# Patient Record
Sex: Female | Born: 1995 | Race: Black or African American | Hispanic: No | Marital: Single | Smoking: Never smoker
Health system: Southern US, Community
[De-identification: ages and names within clinical notes are randomized; demographics above are authoritative.]

---

## 2016-04-28 ENCOUNTER — Emergency Department (HOSPITAL_COMMUNITY): Payer: BLUE CROSS/BLUE SHIELD

## 2016-04-28 ENCOUNTER — Emergency Department (HOSPITAL_COMMUNITY)
Admission: EM | Admit: 2016-04-28 | Discharge: 2016-04-28 | Disposition: A | Discharge status: Home / Self Care | Payer: BLUE CROSS/BLUE SHIELD | Attending: Emergency Medicine | Admitting: Emergency Medicine

## 2016-04-28 ENCOUNTER — Encounter (HOSPITAL_COMMUNITY): Payer: Self-pay

## 2016-04-28 DIAGNOSIS — R05 Cough: Secondary | ICD-10-CM | POA: Diagnosis present

## 2016-04-28 DIAGNOSIS — B349 Viral infection, unspecified: Secondary | ICD-10-CM | POA: Diagnosis not present

## 2016-04-28 MED ORDER — ACETAMINOPHEN 325 MG PO TABS
650.0000 mg | ORAL_TABLET | Freq: Once | ORAL | Status: AC
  Administered 2016-04-28: 650 mg via ORAL
  Filled 2016-04-28: qty 2

## 2016-04-28 MED ORDER — ACETAMINOPHEN 325 MG PO TABS: 650.0000 mg | ORAL_TABLET | Freq: Once | ORAL | Status: DC

## 2016-04-28 NOTE — ED Triage Notes (Addendum)
PT C/O FEVER AND A NONPRODUCTIVE COUGH SINCE Monday. DENIES SORE THROAT, NAUSEA, OR VOMITING. PT LAST TOOK TYLENOL THIS AM.

## 2016-04-28 NOTE — ED Notes (Signed)
Patient transported to X-ray 

## 2016-04-28 NOTE — ED Notes (Signed)
Patient was alert, oriented and stable upon discharge. RN went over AVS and patient had no further questions.  

## 2016-04-28 NOTE — ED Provider Notes (Signed)
[facility]-EMERGENCY DEPT Provider Note   CSN: [id number] Arrival date & time: 04/28/16  1541     History   Chief Complaint Chief Complaint  Patient presents with  . Fever  . Cough    HPI Lynnell Abdon is a 20 y.o. female.Complains of cough onset 3 days ago accompanied by fever. Other associated symptoms include nasal congestion. She denies sore throat denies myalgias denies vomiting or nausea. Treated with DayQuil, NyQuil and Tylenol, without relief. Last dose of Tylenol 10 AM today. No other associated symptoms. Nothing makes symptoms better or worse. She denies shortness of breath  HPI  History reviewed. No pertinent past medical history.  There are no active problems to display for this patient.   History reviewed. No pertinent surgical history. Past history negative OB History    No data available       Home Medications    Prior to Admission medications   Not on File    Family History History reviewed. No pertinent family history.  Social History Social History  Substance Use Topics  . Smoking status: Never Smoker  . Smokeless tobacco: Never Used  . Alcohol use No  No illicit drug use Allergies   Patient has no known allergies.   Review of Systems Review of Systems  Constitutional: Positive for chills and fever.  HENT: Positive for congestion.   Respiratory: Positive for cough.   Cardiovascular: Negative.   Gastrointestinal: Negative.   Musculoskeletal: Negative.   Skin: Negative.   Neurological: Negative.   Psychiatric/Behavioral: Negative.   All other systems reviewed and are negative.    Physical Exam Updated Vital Signs BP 118/90 (BP Location: Left Arm)   Pulse 106   Temp 101.2 F (38.4 C) (Oral)   Resp 16   Ht 5\' 7"  (1.702 m)   Wt 120 lb (54.4 kg)   LMP 04/12/2016   SpO2 100%   BMI 18.79 kg/m   Physical Exam  Constitutional: She appears well-developed and well-nourished. No distress.  HENT:  Head: Normocephalic and atraumatic.   Nose: Nose normal.  Mouth/Throat: Oropharynx is clear and moist.  Eyes: Conjunctivae are normal. Pupils are equal, round, and reactive to light.  Neck: Neck supple. No tracheal deviation present. No thyromegaly present.  Cardiovascular: Regular rhythm.   No murmur heard. Mildly tachycardic  Pulmonary/Chest: Effort normal and breath sounds normal.  Abdominal: Soft. Bowel sounds are normal. She exhibits no distension. There is no tenderness.  Musculoskeletal: Normal range of motion. She exhibits no edema or tenderness.  Neurological: She is alert. Coordination normal.  Skin: Skin is warm and dry. No rash noted.  Psychiatric: She has a normal mood and affect.  Nursing note and vitals reviewed.    ED Treatments / Results  Labs (all labs ordered are listed, but only abnormal results are displayed) Labs Reviewed - No data to display  EKG  EKG Interpretation None       Radiology No results found.  Procedures Procedures (including critical care time)  Medications Ordered in ED Medications  acetaminophen (TYLENOL) tablet 650 mg (not administered)    Chest x-ray viewed by me No results found for this or any previous visit. Dg Chest 2 View  Result Date: 04/28/2016 CLINICAL DATA:  Cough, fever, chills, and shortness of breath for the past 2 days. Nonsmoker. EXAM: CHEST  2 VIEW COMPARISON:  None in PACs FINDINGS: The lungs are adequately inflated. There is no focal infiltrate. There is no pleural effusion. The heart and pulmonary vascularity are normal.  The mediastinum is normal in width. The bony thorax exhibits no acute abnormality. IMPRESSION: There is no active cardiopulmonary disease. Electronically Signed   By: David  SwazilandJordan M.D.   On: 04/28/2016 16:49   Initial Impression / Assessment and Plan / ED Course  I have reviewed the triage vital signs and the nursing notes.  Pertinent labs & imaging results that were available during my care of the patient were reviewed by me  and considered in my medical decision making (see chart for details).  Clinical Course     5:15 PM patient looks well. No distress Plan Tylenol, rest, home observation return as needed or see urgent care center Final Clinical Impressions(s) / ED Diagnoses  Diagnosis viral respiratory illness Final diagnoses:  None    New Prescriptions New Prescriptions   No medications on file     Doug SouSam Angles Trevizo, MD 04/28/16 1718

## 2016-04-28 NOTE — Discharge Instructions (Signed)
Take Tylenol as directed every 4 hours for aches or for temperature higher than 100.4 while awake. If you're not feeling better in a week return or see an urgent care center. Return if concern for any reason.

## 2017-06-13 IMAGING — CR DG CHEST 2V
2 series · 2 of 2 positions shown · non-contrast
Comparison: None in PACs

CLINICAL DATA: Cough, fever, chills, and shortness of breath for
the past 2 days. Nonsmoker.

EXAM:
CHEST  2 VIEW

[w chest pa]
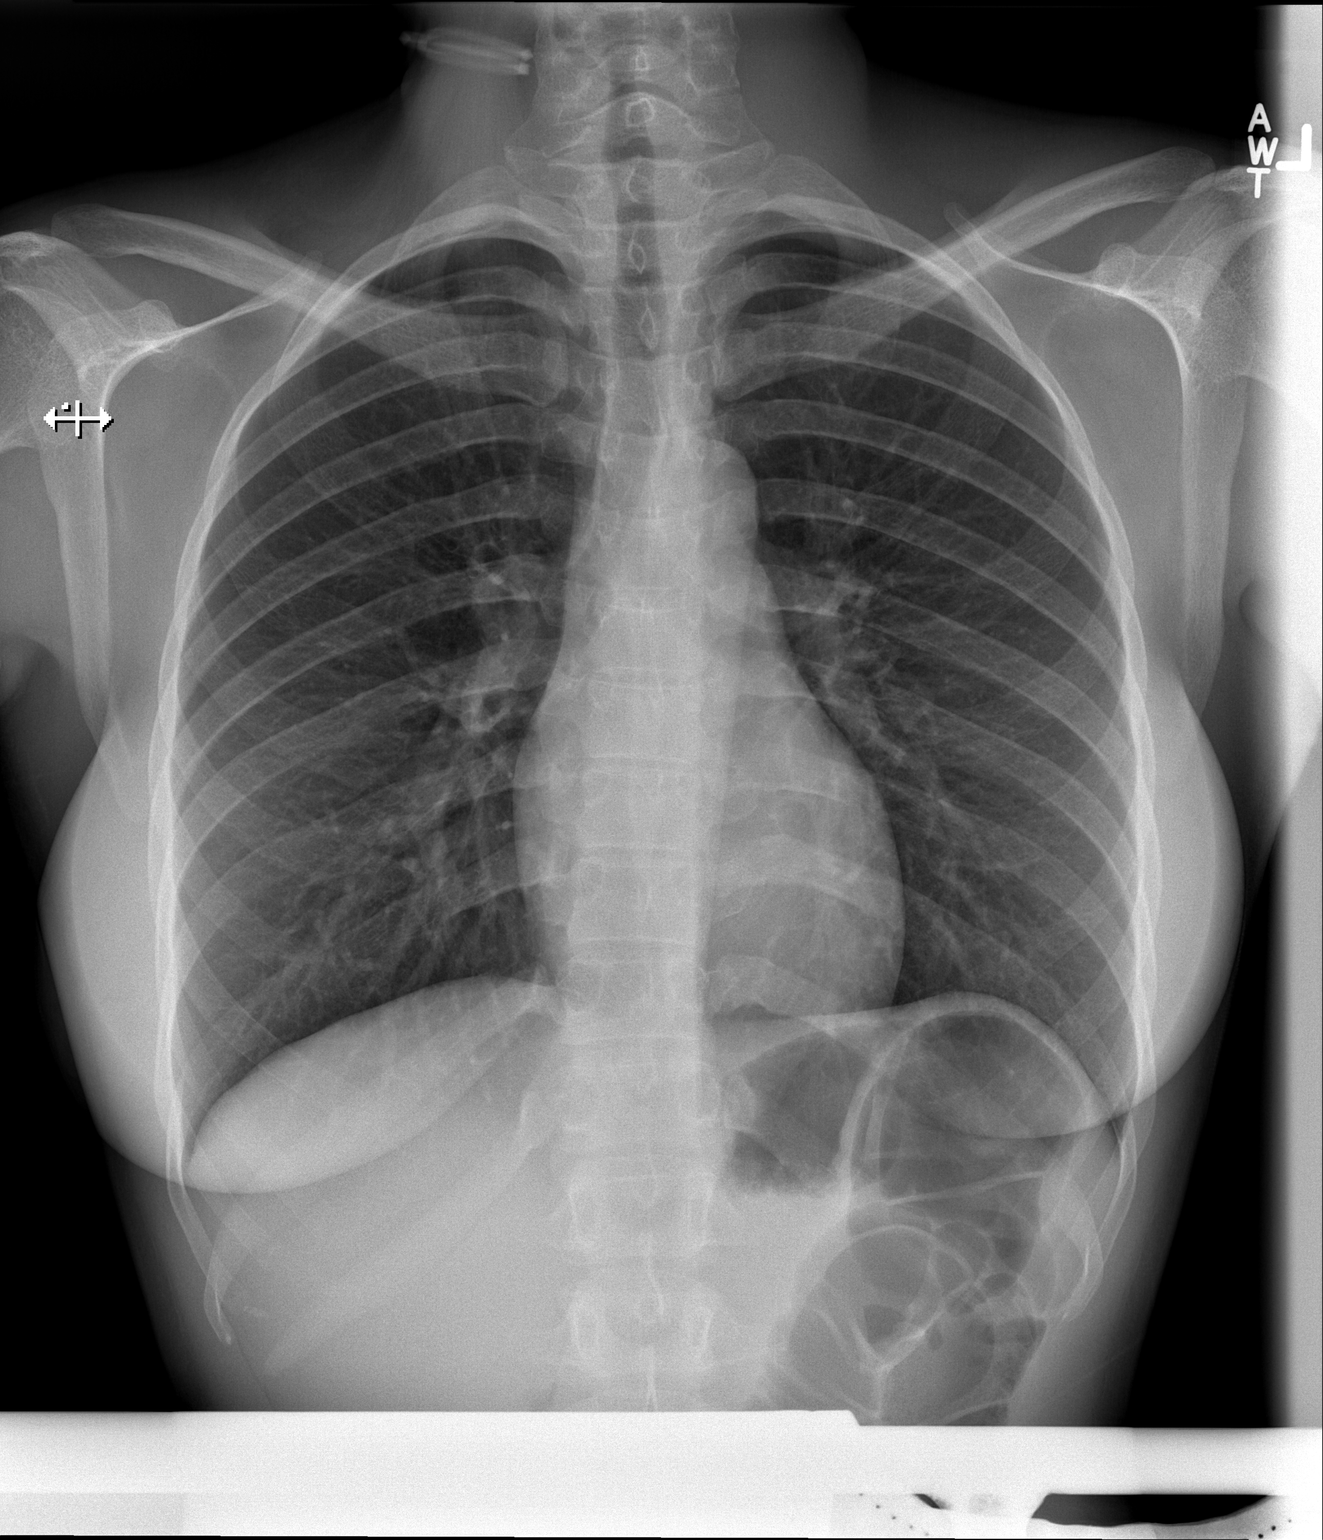

[w chest lat]
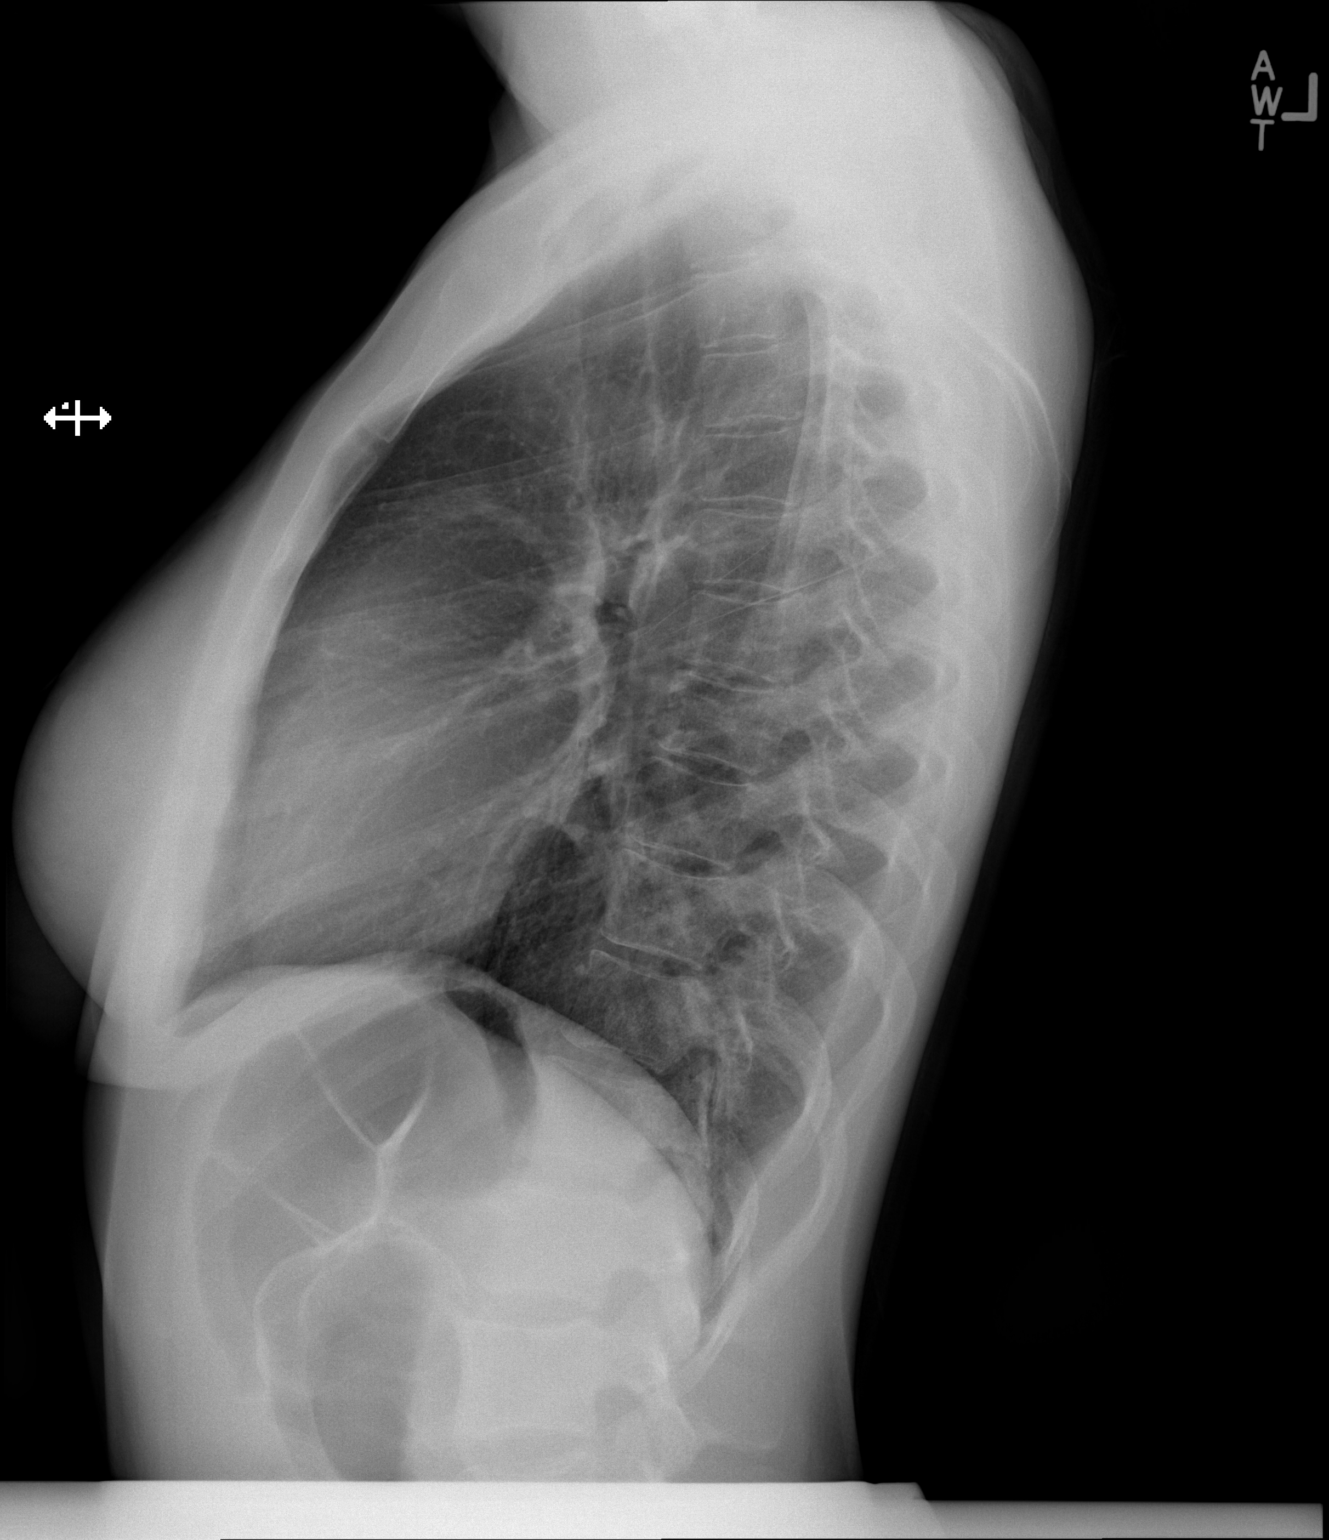

[2 of 2 positions shown; findings below may reference images not displayed]

FINDINGS: The lungs are adequately inflated. There is no focal infiltrate.
There is no pleural effusion. The heart and pulmonary vascularity
are normal. The mediastinum is normal in width. The bony thorax
exhibits no acute abnormality.
IMPRESSION: There is no active cardiopulmonary disease.
